# Patient Record
Sex: Male | Born: 1990 | Race: White | Marital: Single | Smoking: Current every day smoker
Health system: Southern US, Community
[De-identification: ages and names within clinical notes are randomized; demographics above are authoritative.]

## PROBLEM LIST (undated history)

## (undated) DIAGNOSIS — F419 Anxiety disorder, unspecified: Secondary | ICD-10-CM

---

## 2016-03-03 ENCOUNTER — Emergency Department
Admission: EM | Admit: 2016-03-03 | Discharge: 2016-03-03 | Disposition: A | Discharge status: Home / Self Care | Payer: BLUE CROSS/BLUE SHIELD | Attending: Emergency Medicine | Admitting: Emergency Medicine

## 2016-03-03 ENCOUNTER — Encounter: Payer: Self-pay | Admitting: *Deleted

## 2016-03-03 ENCOUNTER — Emergency Department: Payer: BLUE CROSS/BLUE SHIELD

## 2016-03-03 DIAGNOSIS — R0789 Other chest pain: Secondary | ICD-10-CM | POA: Diagnosis present

## 2016-03-03 DIAGNOSIS — F17200 Nicotine dependence, unspecified, uncomplicated: Secondary | ICD-10-CM | POA: Diagnosis not present

## 2016-03-03 HISTORY — DX: Anxiety disorder, unspecified: F41.9

## 2016-03-03 LAB — BASIC METABOLIC PANEL
ANION GAP: 14 (ref 5–15)
BUN: 9 mg/dL (ref 6–20)
CHLORIDE: 104 mmol/L (ref 101–111)
CO2: 20 mmol/L — AB (ref 22–32)
CREATININE: 1.08 mg/dL (ref 0.61–1.24)
Calcium: 9.3 mg/dL (ref 8.9–10.3)
GFR calc non Af Amer: >60 mL/min (ref >60)
Glucose, Bld: 80 mg/dL (ref 65–99)
POTASSIUM: 3.7 mmol/L (ref 3.5–5.1)
SODIUM: 138 mmol/L (ref 135–145)

## 2016-03-03 LAB — TROPONIN I: Troponin I: <0.03 ng/mL (ref <0.031)

## 2016-03-03 LAB — CBC
HCT: 44.3 % (ref 40.0–52.0)
HEMOGLOBIN: 15 g/dL (ref 13.0–18.0)
MCH: 30.2 pg (ref 26.0–34.0)
MCHC: 33.8 g/dL (ref 32.0–36.0)
MCV: 89.4 fL (ref 80.0–100.0)
PLATELETS: 230 10*3/uL (ref 150–440)
RBC: 4.96 MIL/uL (ref 4.40–5.90)
RDW: 13.1 % (ref 11.5–14.5)
WBC: 8.5 10*3/uL (ref 3.8–10.6)

## 2016-03-03 NOTE — ED Notes (Signed)
Pt driving through [place] back to [place], states while driving sudden onset of left upper chest pain and lightheaded, states some SOB, states hx of anxiety, denies any drug use

## 2016-03-03 NOTE — Discharge Instructions (Signed)

## 2016-03-03 NOTE — ED Notes (Signed)
Pt states he was on vacation and driving to [place] by himself and got chest pains 30 minutes prior to arrival.  Pt states he has never had this pain before and has no family hx of MI or PMH of heart disease.  He denies any shortness of breath but reports he did have some lightheadedness.

## 2016-03-03 NOTE — ED Provider Notes (Signed)
[facility] Emergency Department Provider Note  ____________________________________________  Time seen: 6:30 PM  I have reviewed the triage vital signs and the nursing notes.   HISTORY  Chief Complaint Chest Pain    HPI Edward Olson is a 25 y.o. male who complains of sudden onset of left-sided chest pain about one hour ago. Nonradiating, not associated with diaphoresis shortness of breath or vomiting or dizziness. Not exertional. He was sitting still in his car driving back to [place] for him. He had been driving for about 4 hours prior to onset. No past medical history, no family history. No recent trauma, hospitalizations, or surgery. He has a history of multiple injuries to the left shoulder requiring 3 different surgeries. Part of his left clavicle has been removed..     Past Medical History  Diagnosis Date  . Anxiety      There are no active problems to display for this patient.    History reviewed. No pertinent past surgical history. Left clavicle surgery and left shoulder surgery.  No current outpatient prescriptions on file. Vyvanse Clonazepam  Allergies Review of patient's allergies indicates no known allergies.   History reviewed. No pertinent family history.  Social History Social History  Substance Use Topics  . Smoking status: Current Every Day Smoker  . Smokeless tobacco: None  . Alcohol Use: None    Review of Systems  Constitutional:   No fever or chills.  Eyes:   No vision changes.  ENT:   No sore throat. No rhinorrhea. Cardiovascular:   Positive as above chest pain. Respiratory:   No dyspnea or cough. Gastrointestinal:   Negative for abdominal pain, vomiting and diarrhea.  Genitourinary:   Negative for dysuria or difficulty urinating. Musculoskeletal:   Negative for focal pain or swelling Neurological:   Negative for headaches 10-point ROS otherwise  negative.  ____________________________________________   PHYSICAL EXAM:  VITAL SIGNS: ED Triage Vitals  Enc Vitals Group     BP 03/03/16 1703 131/76 mmHg     Pulse Rate 03/03/16 1703 94     Resp 03/03/16 1703 18     Temp 03/03/16 1703 98.3 F (36.8 C)     Temp Source 03/03/16 1703 Oral     SpO2 03/03/16 1703 100 %     Weight 03/03/16 1703 178 lb (80.74 kg)     Height 03/03/16 1703 5\' 10"  (1.778 m)     Head Cir --      Peak Flow --      Pain Score 03/03/16 1704 6     Pain Loc --      Pain Edu? --      Excl. in GC? --     Vital signs reviewed, nursing assessments reviewed.   Constitutional:   Alert and oriented. Well appearing and in no distress. Eyes:   No scleral icterus. No conjunctival pallor. PERRL. EOMI.  No nystagmus. ENT   Head:   Normocephalic and atraumatic.   Nose:   No congestion/rhinnorhea. No septal hematoma   Mouth/Throat:   MMM, no pharyngeal erythema. No peritonsillar mass.    Neck:   No stridor. No SubQ emphysema. No meningismus. Hematological/Lymphatic/Immunilogical:   No cervical lymphadenopathy. Cardiovascular:   RRR. Symmetric bilateral radial and DP pulses.  No murmurs.  Respiratory:   Normal respiratory effort without tachypnea nor retractions. Breath sounds are clear and equal bilaterally. No wheezes/rales/rhonchi. Musculoskeletal:   Nontender with normal range of motion in all extremities. No joint effusions.  No  lower extremity tenderness.  No edema. Chest pain is reproduced with palpation of the left inferolateral chest wall. No crepitus or instability. Neurologic:   Normal speech and language.  CN 2-10 normal. Motor grossly intact. Ambulatory. No gross focal neurologic deficits are appreciated.    ____________________________________________    LABS (pertinent positives/negatives) (all labs ordered are listed, but only abnormal results are displayed) Labs Reviewed  BASIC METABOLIC PANEL - Abnormal; Notable for the  following:    CO2 20 (*)    All other components within normal limits  CBC  TROPONIN I   ____________________________________________   EKG  Interpreted by me Normal sinus rhythm rate of 95, normal axis and intervals. Incomplete right bundle-branch block, normal ST segments and T waves.  ____________________________________________    RADIOLOGY  Chest x-ray unremarkable  ____________________________________________   PROCEDURES   ____________________________________________   INITIAL IMPRESSION / ASSESSMENT AND PLAN / ED COURSE  Pertinent labs & imaging results that were available during my care of the patient were reviewed by me and considered in my medical decision making (see chart for details).  Patient presents with left-sided chest pain, reproducible on palpation. No significant cardiac risk factors other than smoking. Heart score is low risk. No further workup required. EKG unremarkable. Vital signs unremarkable. Clear chest wall pain, follow up with primary care, take NSAIDs. Advised against taking any sedatives such as clonazepam while driving.Considering the patient's symptoms, medical history, and physical examination today, I have low suspicion for ACS, PE, TAD, pneumothorax, carditis, mediastinitis, pneumonia, CHF, or sepsis.       ____________________________________________   FINAL CLINICAL IMPRESSION(S) / ED DIAGNOSES  Final diagnoses:  Chest wall pain       Portions of this note were generated with dragon dictation software. Dictation errors may occur despite best attempts at proofreading.   Sharman Cheek, MD 03/03/16 [phone]

## 2017-08-26 IMAGING — CR DG CHEST 2V
2 series · 2 of 2 positions shown · non-contrast
Comparison: None.

CLINICAL DATA: 25-year-old male with a history of chest pain for 25
minutes

EXAM:
CHEST - 2 VIEW

[chest pa]
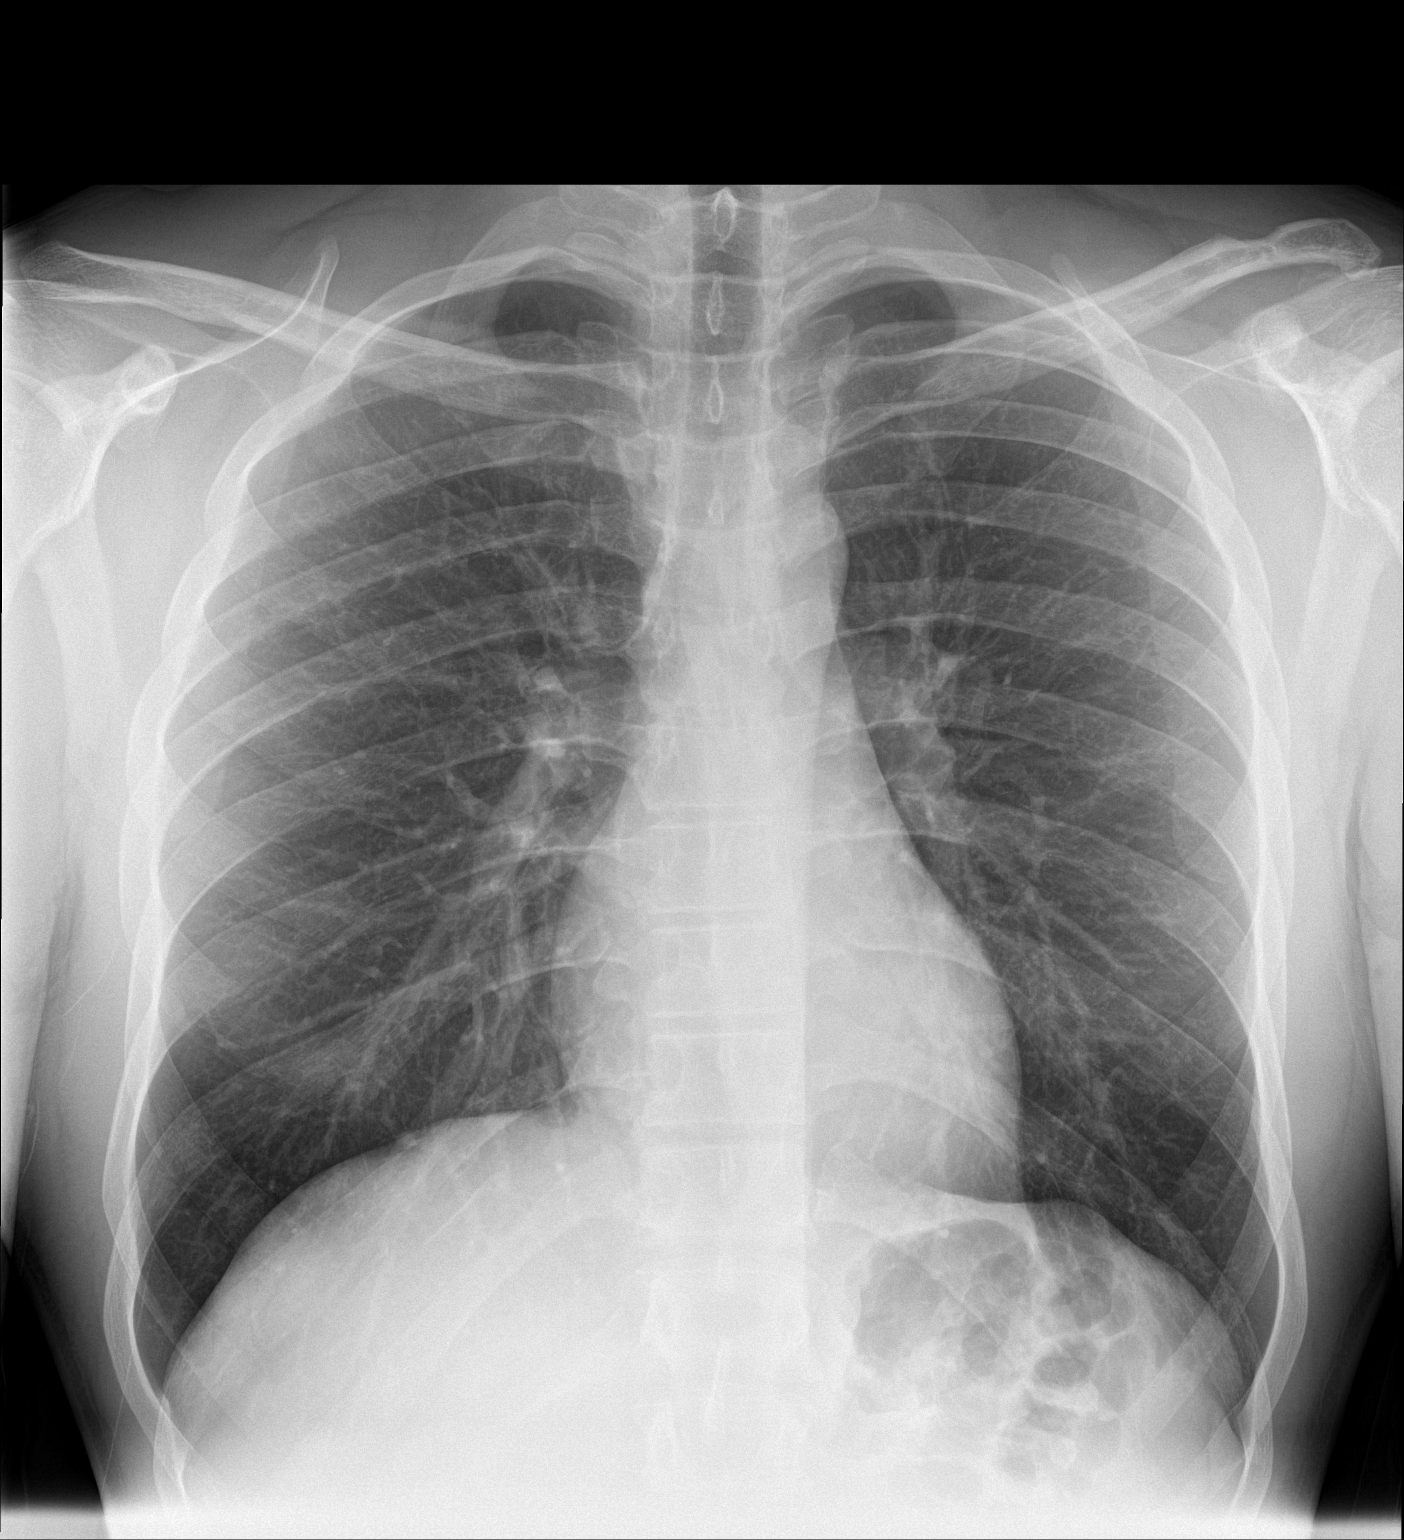

[chest lat]
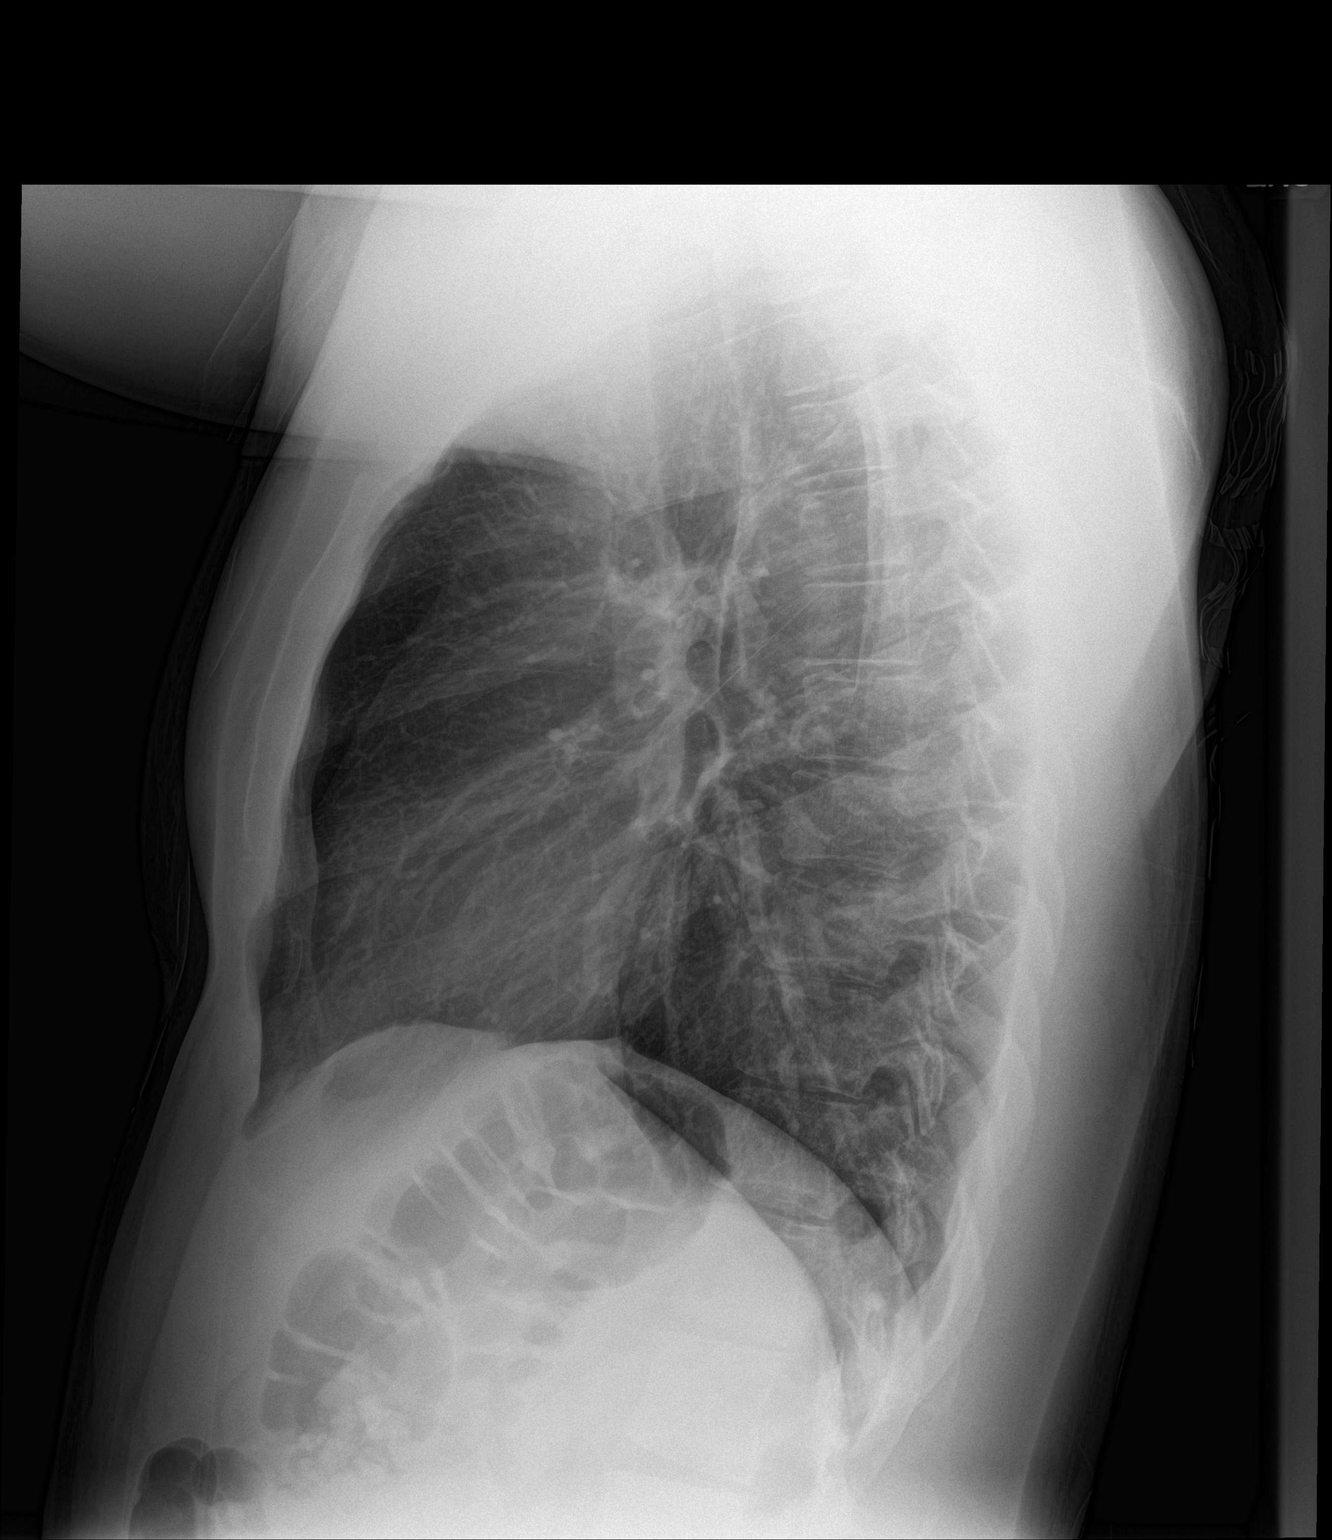

[2 of 2 positions shown; findings below may reference images not displayed]

FINDINGS: Cardiomediastinal silhouette projects within normal limits in size
and contour. No confluent airspace disease, pneumothorax, or pleural
effusion.

No displaced fracture. Irregularity at the distal left clavicle
cortex.

Unremarkable appearance of the upper abdomen.
IMPRESSION: No radiographic evidence of acute cardiopulmonary disease.

Irregularity at the distal left clavicular cortex, uncertain
significance. If the patient has tenderness to palpation, a
dedicated clavicle series may be useful.
